# Patient Record
Sex: Female | Born: 2000 | Race: Black or African American | Hispanic: No | Smoking: Never smoker
Health system: Southern US, Community
[De-identification: ages and names within clinical notes are randomized; demographics above are authoritative.]

## PROBLEM LIST (undated history)

## (undated) DIAGNOSIS — Z789 Other specified health status: Secondary | ICD-10-CM

## (undated) HISTORY — PX: TONSILLECTOMY: SUR1361

---
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
## 2021-08-27 IMAGING — CT CT KNEE*R* W/O CM
3 of 4 series · 16 of 33 positions shown, 18 images · non-contrast
Comparison: Same day knee radiograph

CLINICAL DATA: Evaluate tibial plateau fracture

EXAM:
CT OF THE RIGHT KNEE WITHOUT CONTRAST
TECHNIQUE: Multidetector CT imaging of the right knee was performed according
to the standard protocol. Multiplanar CT image reconstructions were
also generated.

[Series 3: axial st · axial · 0.31mm/px · z∈[+774,+912]mm · 8 of 83 slices shown, 10 images]
[im 7/83  soft-tissue]
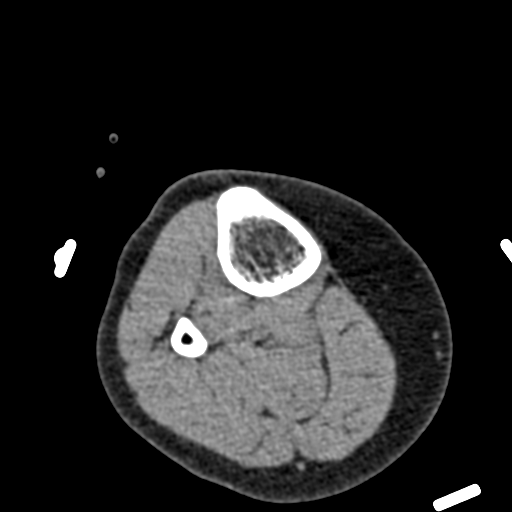
[im 7/83  bone]
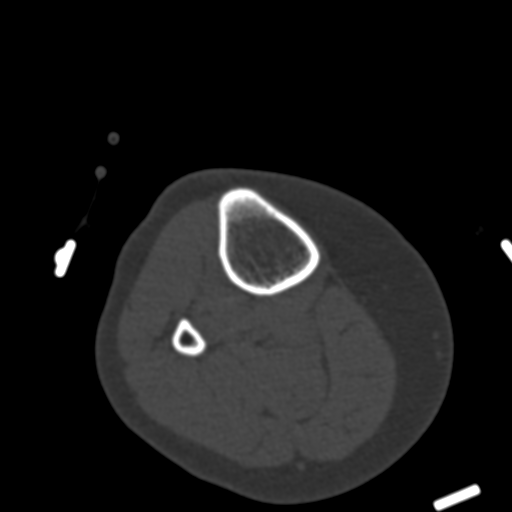
[im 19/83  bone]
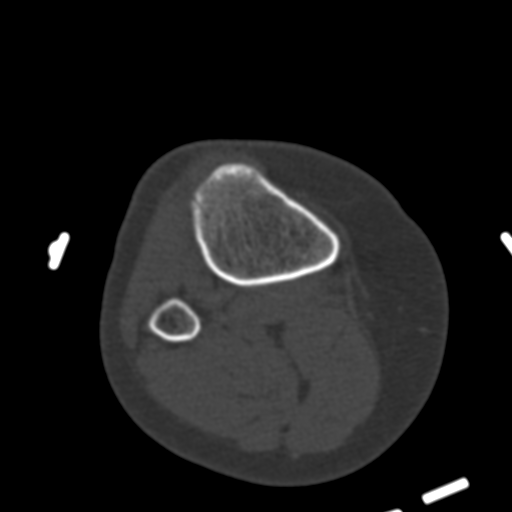
[im 26/83  bone]
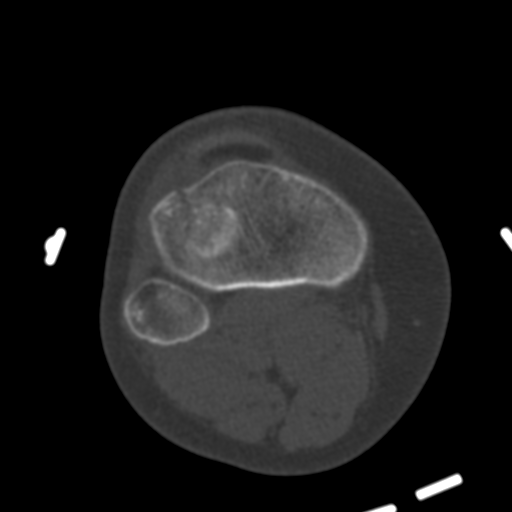
[im 38/83  bone]
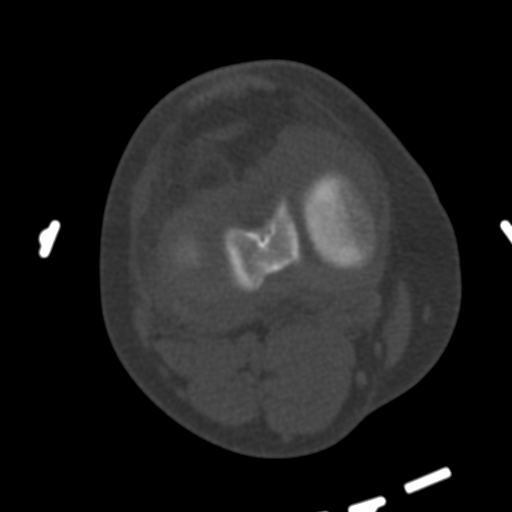
[im 45/83  soft-tissue]
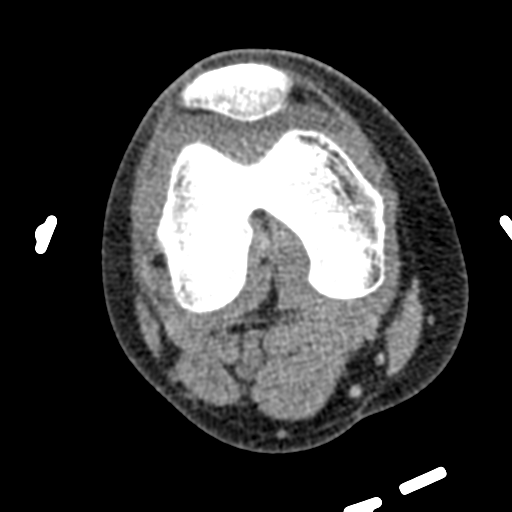
[im 45/83  bone]
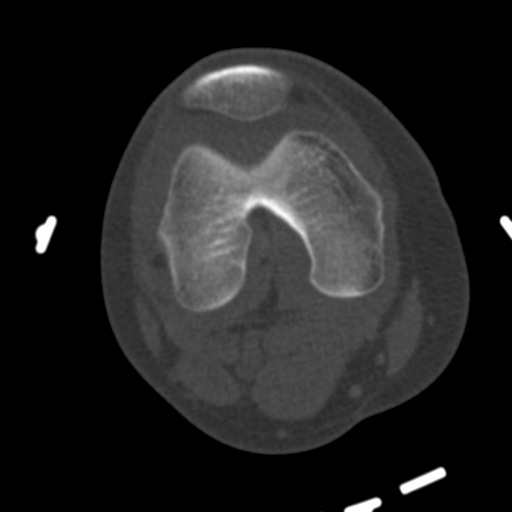
[im 57/83  bone]
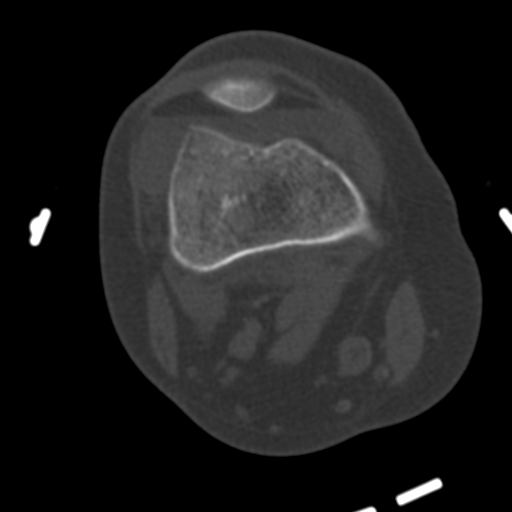
[im 64/83  bone]
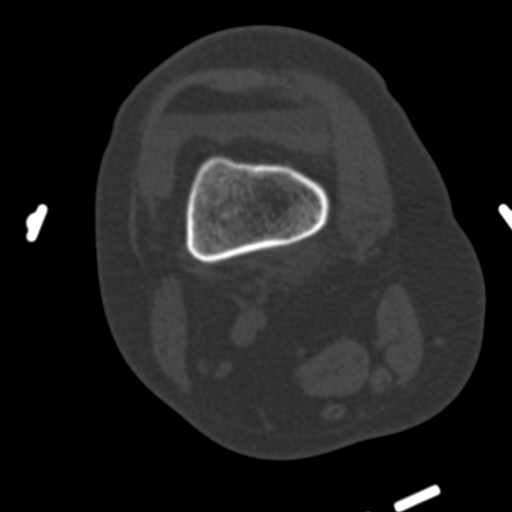
[im 76/83  bone]
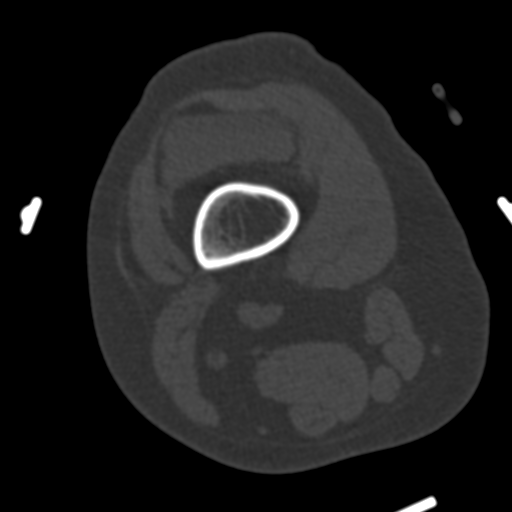

[Series 8: coronal st · coronal · 0.33mm/px · 3 of 86 slices shown]
[im 18/86  bone]
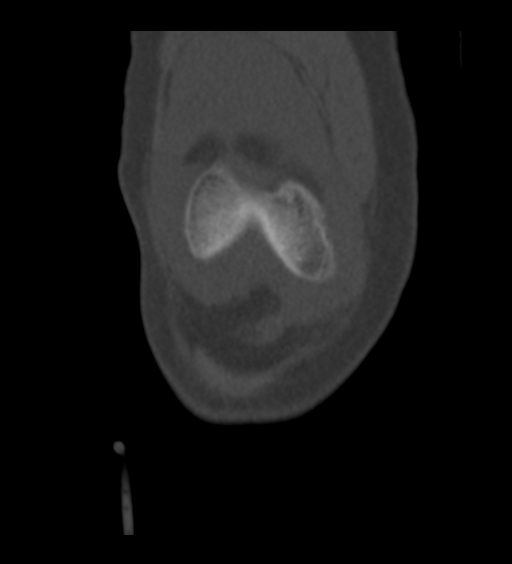
[im 35/86  bone]
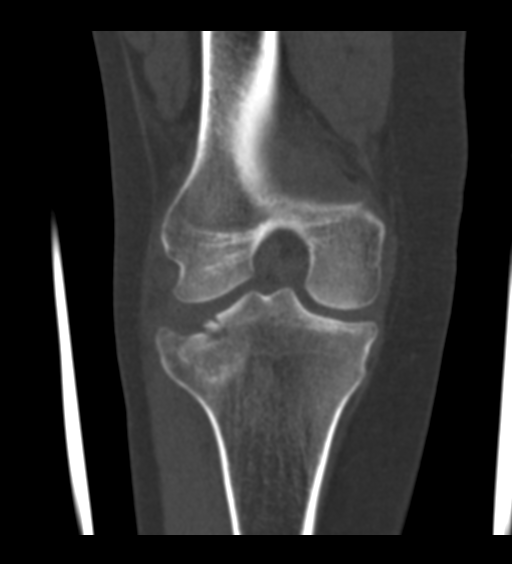
[im 52/86  bone]
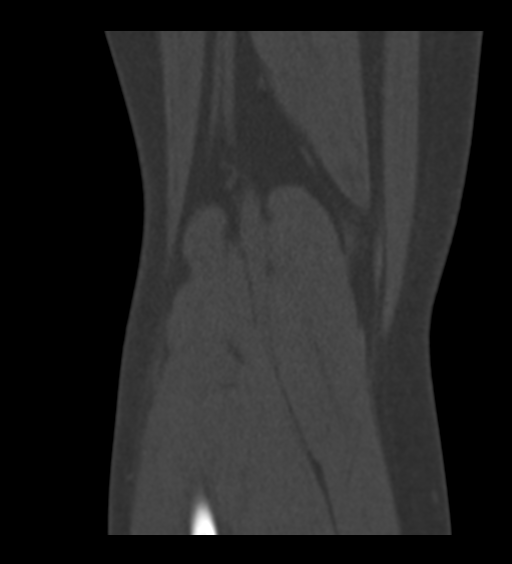

[Series 9: sagittal st · sagittal · 0.35mm/px · 5 of 96 slices shown]
[im 16/96  bone]
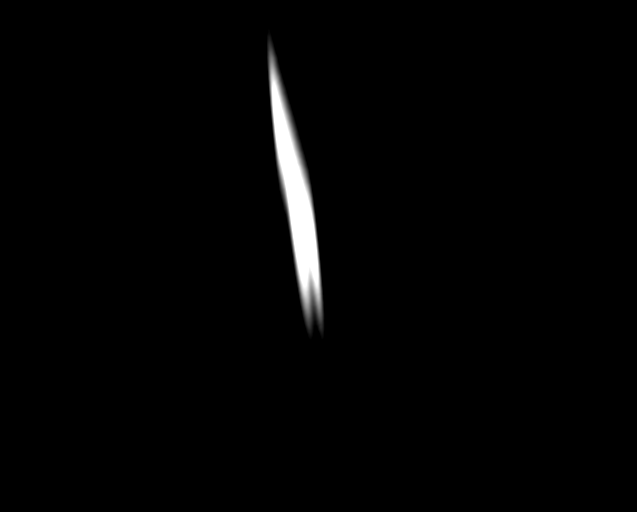
[im 32/96  bone]
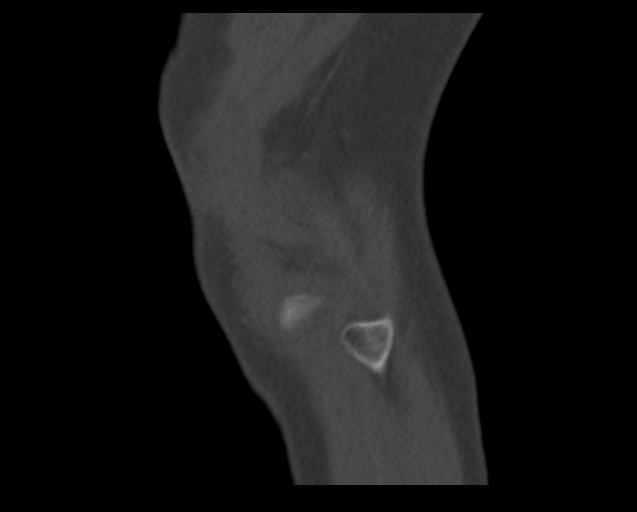
[im 48/96  bone]
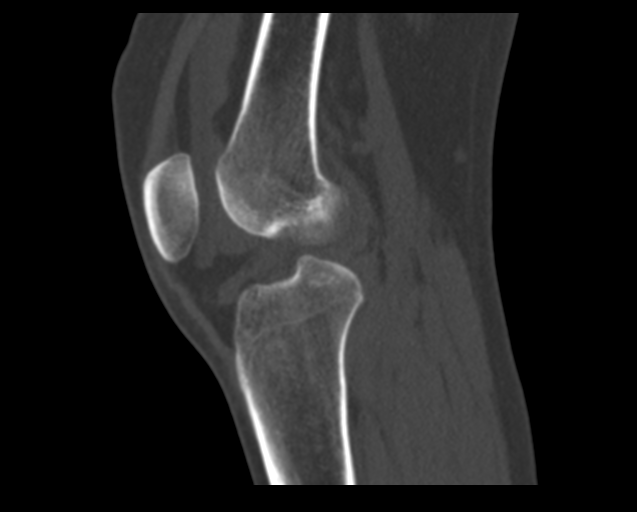
[im 64/96  bone]
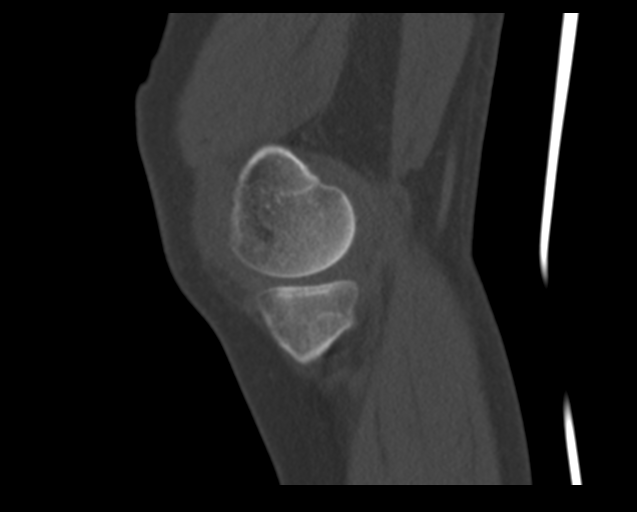
[im 80/96  bone]
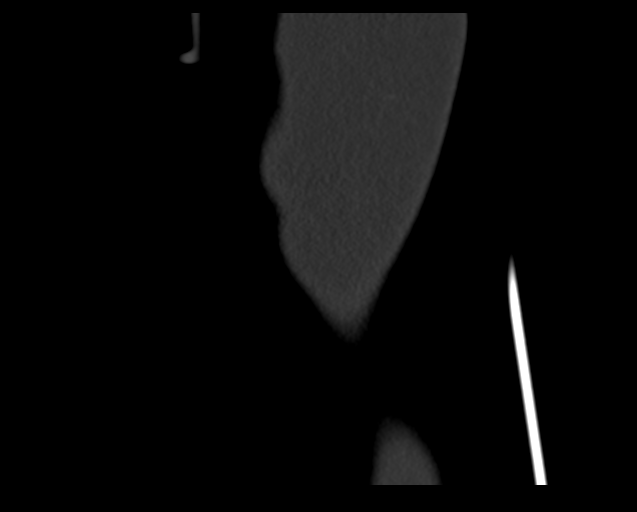

[16 of 33 positions shown; findings below may reference images not displayed]

FINDINGS: Bones/Joint/Cartilage

Acute fracture of the lateral tibial plateau. There is up to 7 mm of
articular-surface depression of a central fragment which measures
approximately 17 x 15 mm (series 6, image 25; series 7, image 27).
There is a vertical nondisplaced fracture component extending to the
anterolateral aspect of the proximal tibial metaphysis (series 6,
image 20). Fracture lines are not seen to involve the tibial
eminence. No fracture involvement of the medial tibial plateau. No
evidence of intra-articular extension to the proximal tibiofibular
joint. The visualized fibula, femur, and patella are intact without
fracture. Knee joint spaces are well maintained. Large knee joint
lipohemarthrosis.

Ligaments

Suboptimally assessed by CT.

Muscles and Tendons

No acute musculotendinous abnormality by CT.

Soft tissues

Mild soft tissue swelling and edema overlying the fracture site. No
organized fluid collection or hematoma.
IMPRESSION: 1. Acute lateral tibial plateau fracture with up to 7 mm of
articular-surface depression of a central fragment (Schatzker type
II).
2. Large knee joint lipohemarthrosis.

## 2021-09-02 IMAGING — RF DG C-ARM 1-60 MIN
1 series · 2 of 2 positions shown · non-contrast
Comparison: None.

CLINICAL DATA: Right tibial plateau fracture fixation

EXAM:
DG C-ARM 1-60 MIN; RIGHT KNEE - 1-2 VIEW
FLUOROSCOPY TIME:  Fluoroscopy Time:  36 seconds

[Series 1: unknown protocol · 0.14mm/px · 2 of 2 slices shown]
[im 1/2]
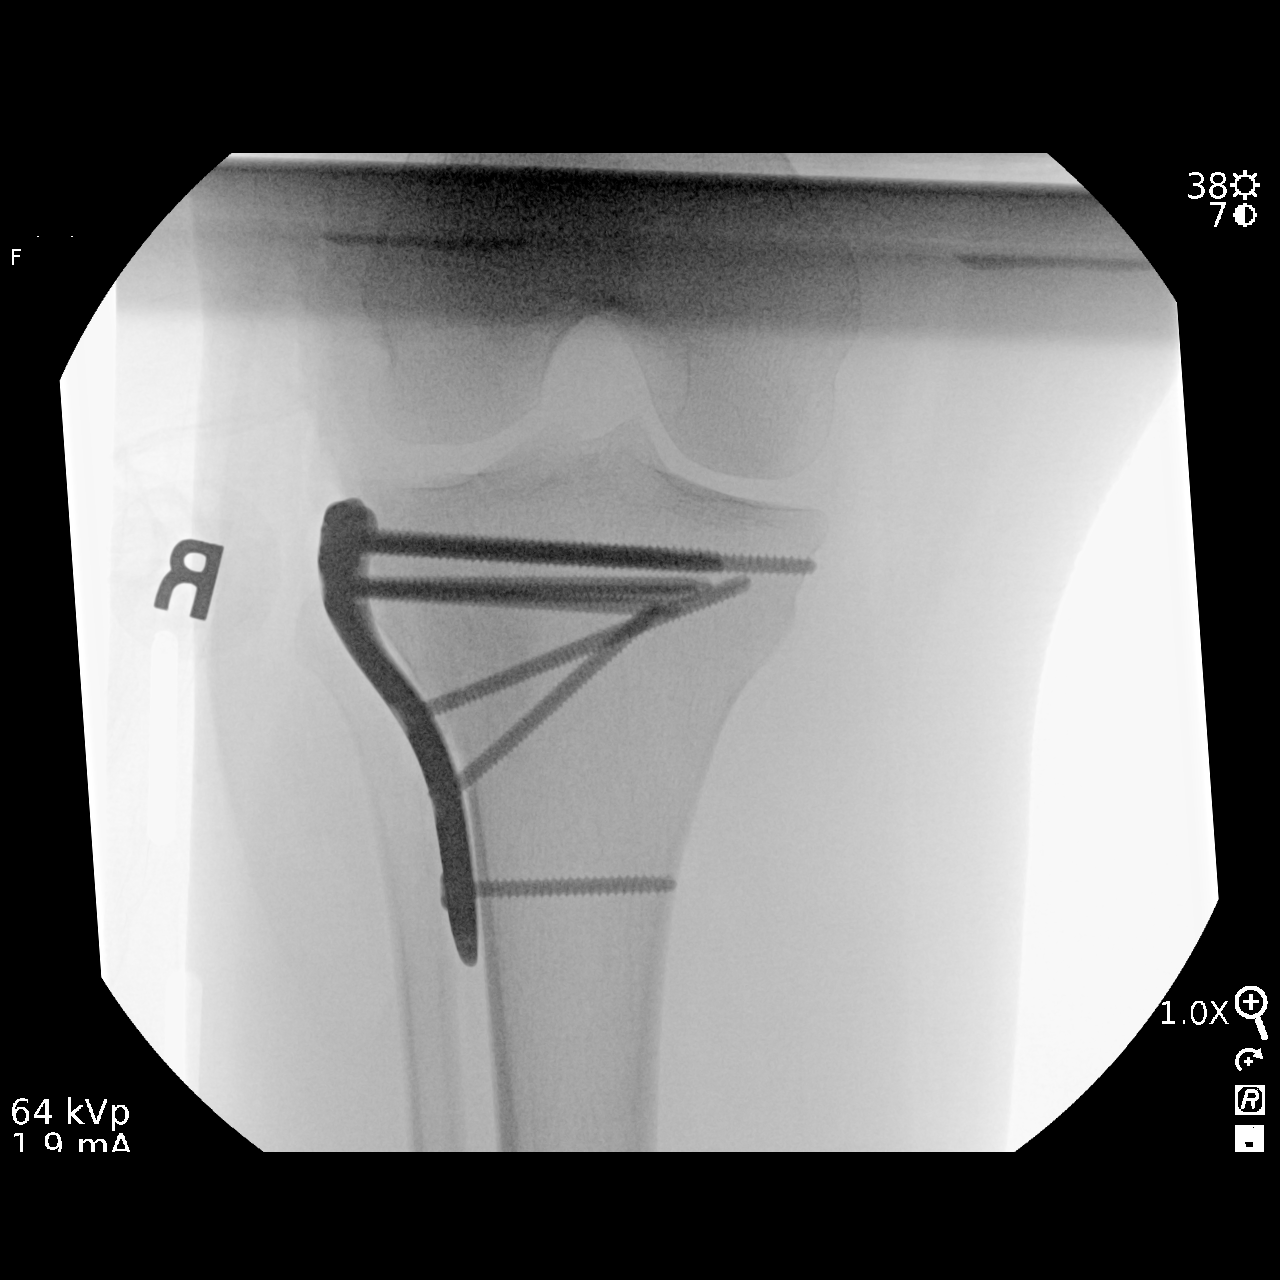
[im 2/2]
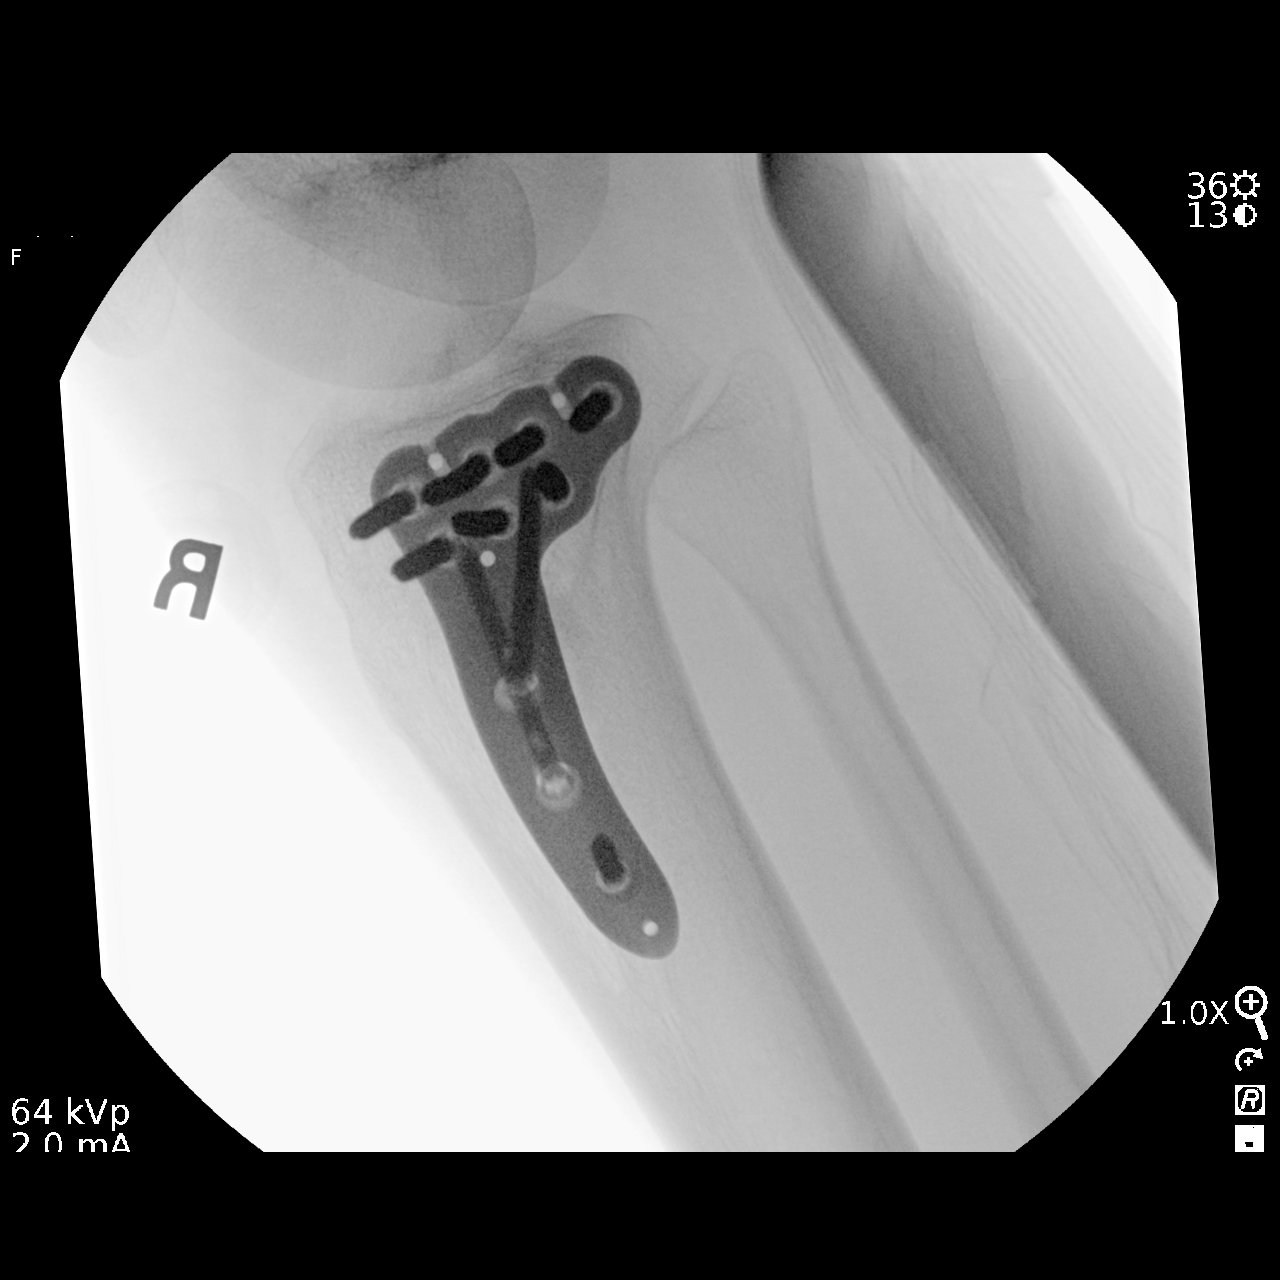

[2 of 2 positions shown; findings below may reference images not displayed]

FINDINGS: Frontal and lateral intraoperative views demonstrate plate and screw
fixation with plate along the lateral aspect of the right tibia and
multiple screws.
IMPRESSION: Fluoroscopic guidance for right lateral tibial plateau ORIF.
